# Patient Record
Sex: Male | Born: 1965
Health system: Southern US, Community
[De-identification: ages and names within clinical notes are randomized; demographics above are authoritative.]

---

## 1998-01-21 ENCOUNTER — Emergency Department (HOSPITAL_COMMUNITY): Admission: EM | Admit: 1998-01-21 | Discharge: 1998-01-21 | Payer: Self-pay | Admitting: Emergency Medicine

## 1999-03-25 ENCOUNTER — Emergency Department (HOSPITAL_COMMUNITY): Admission: EM | Admit: 1999-03-25 | Discharge: 1999-03-25 | Payer: Self-pay | Admitting: Emergency Medicine

## 2008-07-21 ENCOUNTER — Emergency Department (HOSPITAL_BASED_OUTPATIENT_CLINIC_OR_DEPARTMENT_OTHER): Admission: EM | Admit: 2008-07-21 | Discharge: 2008-07-21 | Payer: Self-pay | Admitting: Emergency Medicine

## 2008-07-21 ENCOUNTER — Ambulatory Visit: Payer: Self-pay | Admitting: Diagnostic Radiology

## 2008-07-25 ENCOUNTER — Emergency Department (HOSPITAL_BASED_OUTPATIENT_CLINIC_OR_DEPARTMENT_OTHER): Admission: EM | Admit: 2008-07-25 | Discharge: 2008-07-25 | Payer: Self-pay | Admitting: Emergency Medicine

## 2010-04-23 ENCOUNTER — Encounter: Admission: RE | Admit: 2010-04-23 | Discharge: 2010-04-23 | Payer: Self-pay | Admitting: Neurological Surgery

## 2010-07-06 ENCOUNTER — Ambulatory Visit (HOSPITAL_COMMUNITY)
Admission: RE | Admit: 2010-07-06 | Discharge: 2010-07-06 | Payer: Self-pay | Source: Home / Self Care | Attending: Neurological Surgery | Admitting: Neurological Surgery

## 2010-08-24 LAB — BASIC METABOLIC PANEL
CO2: 25 mEq/L (ref 19–32)
Calcium: 9.8 mg/dL (ref 8.4–10.5)
Creatinine, Ser: 0.97 mg/dL (ref 0.4–1.5)
GFR calc Af Amer: >60 mL/min (ref >60)

## 2010-08-24 LAB — DIFFERENTIAL
Basophils Absolute: 0.1 10*3/uL (ref 0.0–0.1)
Basophils Relative: 1 % (ref 0–1)
Eosinophils Absolute: 0.1 10*3/uL (ref 0.0–0.7)
Monocytes Relative: 10 % (ref 3–12)
Neutrophils Relative %: 51 % (ref 43–77)

## 2010-08-24 LAB — PROTIME-INR: Prothrombin Time: 12.7 seconds (ref 11.6–15.2)

## 2010-08-24 LAB — CBC
MCH: 31.2 pg (ref 26.0–34.0)
Platelets: 350 10*3/uL (ref 150–400)
RBC: 5.16 MIL/uL (ref 4.22–5.81)
WBC: 7.9 10*3/uL (ref 4.0–10.5)

## 2010-08-24 LAB — SURGICAL PCR SCREEN: MRSA, PCR: NEGATIVE

## 2010-08-29 ENCOUNTER — Ambulatory Visit (HOSPITAL_COMMUNITY)
Admission: RE | Admit: 2010-08-29 | Discharge: 2010-08-29 | Disposition: A | Discharge status: Home / Self Care | Attending: Neurological Surgery | Admitting: Neurological Surgery

## 2010-08-29 ENCOUNTER — Ambulatory Visit (HOSPITAL_COMMUNITY)

## 2010-08-29 DIAGNOSIS — M47812 Spondylosis without myelopathy or radiculopathy, cervical region: Secondary | ICD-10-CM | POA: Insufficient documentation

## 2010-09-10 NOTE — Op Note (Signed)
James Savage, James NO.:  [id number]  MEDICAL RECORD NO.:  [id number]           PATIENT TYPE:  O  LOCATION:  3526                         FACILITY:  [facility]  PHYSICIAN:  Tia Alert, MD     DATE OF BIRTH:  07-03-1966  DATE OF PROCEDURE:  08/29/2010 DATE OF DISCHARGE:  08/29/2010                              OPERATIVE REPORT   PREOPERATIVE DIAGNOSIS:  Cervical spondylosis with foraminal stenosis C5- 6 on the right with right C6 radiculopathy.  POSTOPERATIVE DIAGNOSIS:  Cervical spondylosis with foraminal stenosis C5-6 on the right with right C6 radiculopathy.  PROCEDURES: 1. Decompressive anterior cervical diskectomy, C5-6. 2. Anterior cervical arthrodesis, C5-6 utilizing an 8-mm PEEK     interbody cage packed with local autograft and Actifuse putty. 3. Anterior cervical plating, C5-6 utilizing the Biomet MaxAn plate.  SURGEON:  Tia Alert, MD  ASSISTANT:  Donalee Citrin, MD  ANESTHESIA:  General endotracheal.  COMPLICATIONS:  None apparent.  INDICATIONS FOR PROCEDURE:  Mr. Grove is a 45 year old gentleman who presented with right arm pain with numbness to his thumb.  He seemed to have a fairly strict C6 radiculopathy.  MRI was fairly unimpressive, but a CT myelogram showed underfilling of the right C6 nerve root.  There was also a small disk protrusion to the right at C6-7, but did not seem to cause any underfilling of the nerve root, and his symptoms seemed to follow a C6 radicular distribution more than C7, and therefore I recommended a simple ACDF plating at C5-6 in hopes of improving his pain syndrome.  He understood the risks, benefits, and expected outcome, and wished to proceed.  DESCRIPTION OF PROCEDURE:  The patient was taken to the operating room and after induction of adequate generalized endotracheal anesthesia, he was placed in supine position on the operating table.  His right anterior cervical region was prepped with DuraPrep and  draped in the usual sterile fashion.  Local anesthesia 3 mL injected, and a transverse incision was made to the right of midline and carried down to the platysma.  The platysma was opened and then undermined with Metzenbaum scissors.  I then dissected in a plane medial to the sternocleidomastoid muscle and internal carotid artery, and lateral to the trachea and esophagus to expose C5-6.  Intraoperative fluoroscopy confirmed my level and then I took down the longus colli muscles and placed the Shadow-Line retractor under this to expose C5-6.  The annulus was incised and initial diskectomy was done with pituitary rongeurs and curved curettes. I then used the high-speed drill to drill the endplates down to the level of the posterior longitudinal ligament.  The drill shavings were saved in a mucus trap for later arthrodesis.  The posterior longitudinal ligament was opened with a nerve hook and removed while undercutting the bodies of C5 and C6.  We marched out to the proximal takeoff of the C6 nerve root on the left.  We undercut the vertebral bodies of C5-C6 from the right.  We performed a generous foraminotomy and marched out distally along the nerve root distal to the pedicle level, and the nerve  root hook could pass easily along the nerve root.  We could see a significant portion of the nerve root.  We felt like we had a good decompression of what we felt was the symptomatic nerve root.  The nerve hook again passed easily into the midline and along the C6 nerve roots; therefore, I irrigated with saline solution.  I dried the surgical bed. I measured the interspace to be 8 mm, and used a corresponding PEEK interbody cage, packed with local autograft, Actifuse putty, and tapped this into position at C5-6.  I then used the MaxAn endplate and placed two 14-mm variable angle screws in the bodies of C5 and C6 and locked these into plate by locking mechanism within the plate.  I then irrigated  with saline solution, dried all bleeding points with bipolar cautery and with Surgifoam.  Once meticulous hemostasis was achieved, I closed the platysma with 3-0 Vicryl closing subcuticular tissue with 3-0 Vicryl and closed the skin with Benzoin and Steri-Strips.  A sterile dressing was applied.  The patient was awakened from general anesthesia and transferred to recovery room in stable condition.  At the end of the procedure, all sponge, needle, and instruments counts were correct.     Tia Alert, MD     DSJ/MEDQ  D:  08/29/2010  T:  08/30/2010  Job:  [id number]  Electronically Signed by Marikay Alar MD on 09/10/2010 01:00:51 PM

## 2010-10-01 ENCOUNTER — Other Ambulatory Visit: Payer: Self-pay | Admitting: Neurological Surgery

## 2010-10-01 ENCOUNTER — Ambulatory Visit
Admission: RE | Admit: 2010-10-01 | Discharge: 2010-10-01 | Disposition: A | Discharge status: Home / Self Care | Payer: Worker's Compensation | Source: Ambulatory Visit | Attending: Neurological Surgery | Admitting: Neurological Surgery

## 2010-10-01 DIAGNOSIS — M47812 Spondylosis without myelopathy or radiculopathy, cervical region: Secondary | ICD-10-CM

## 2010-10-01 DIAGNOSIS — M5412 Radiculopathy, cervical region: Secondary | ICD-10-CM

## 2010-10-09 LAB — BASIC METABOLIC PANEL
BUN: 10 mg/dL (ref 6–23)
CO2: 27 mEq/L (ref 19–32)
Chloride: 104 mEq/L (ref 96–112)
Creatinine, Ser: 0.95 mg/dL (ref 0.4–1.5)

## 2010-10-09 LAB — CBC
MCH: 32.2 pg (ref 26.0–34.0)
MCHC: 34.3 g/dL (ref 30.0–36.0)
MCV: 93.9 fL (ref 78.0–100.0)
Platelets: 342 10*3/uL (ref 150–400)
RDW: 12.4 % (ref 11.5–15.5)

## 2010-10-09 LAB — DIFFERENTIAL
Basophils Absolute: 0 10*3/uL (ref 0.0–0.1)
Eosinophils Absolute: 0.1 10*3/uL (ref 0.0–0.7)
Eosinophils Relative: 2 % (ref 0–5)

## 2010-10-09 LAB — PROTIME-INR: Prothrombin Time: 12.7 seconds (ref 11.6–15.2)

## 2010-11-06 ENCOUNTER — Other Ambulatory Visit: Payer: Self-pay | Admitting: Neurological Surgery

## 2010-11-06 ENCOUNTER — Ambulatory Visit
Admission: RE | Admit: 2010-11-06 | Discharge: 2010-11-06 | Disposition: A | Discharge status: Home / Self Care | Payer: Self-pay | Source: Ambulatory Visit | Attending: Neurological Surgery | Admitting: Neurological Surgery

## 2010-11-06 DIAGNOSIS — M47812 Spondylosis without myelopathy or radiculopathy, cervical region: Secondary | ICD-10-CM

## 2010-11-06 DIAGNOSIS — M5412 Radiculopathy, cervical region: Secondary | ICD-10-CM

## 2010-12-18 ENCOUNTER — Ambulatory Visit: Payer: Self-pay | Admitting: Physical Therapy

## 2011-05-09 IMAGING — CR DG CERVICAL SPINE 1V
1 series · 1 of 1 positions shown · non-contrast
Comparison: 08/29/2010

CLINICAL DATA: Postop.

LUMBAR SPINE - 1 VIEW

[w c-spine lat]
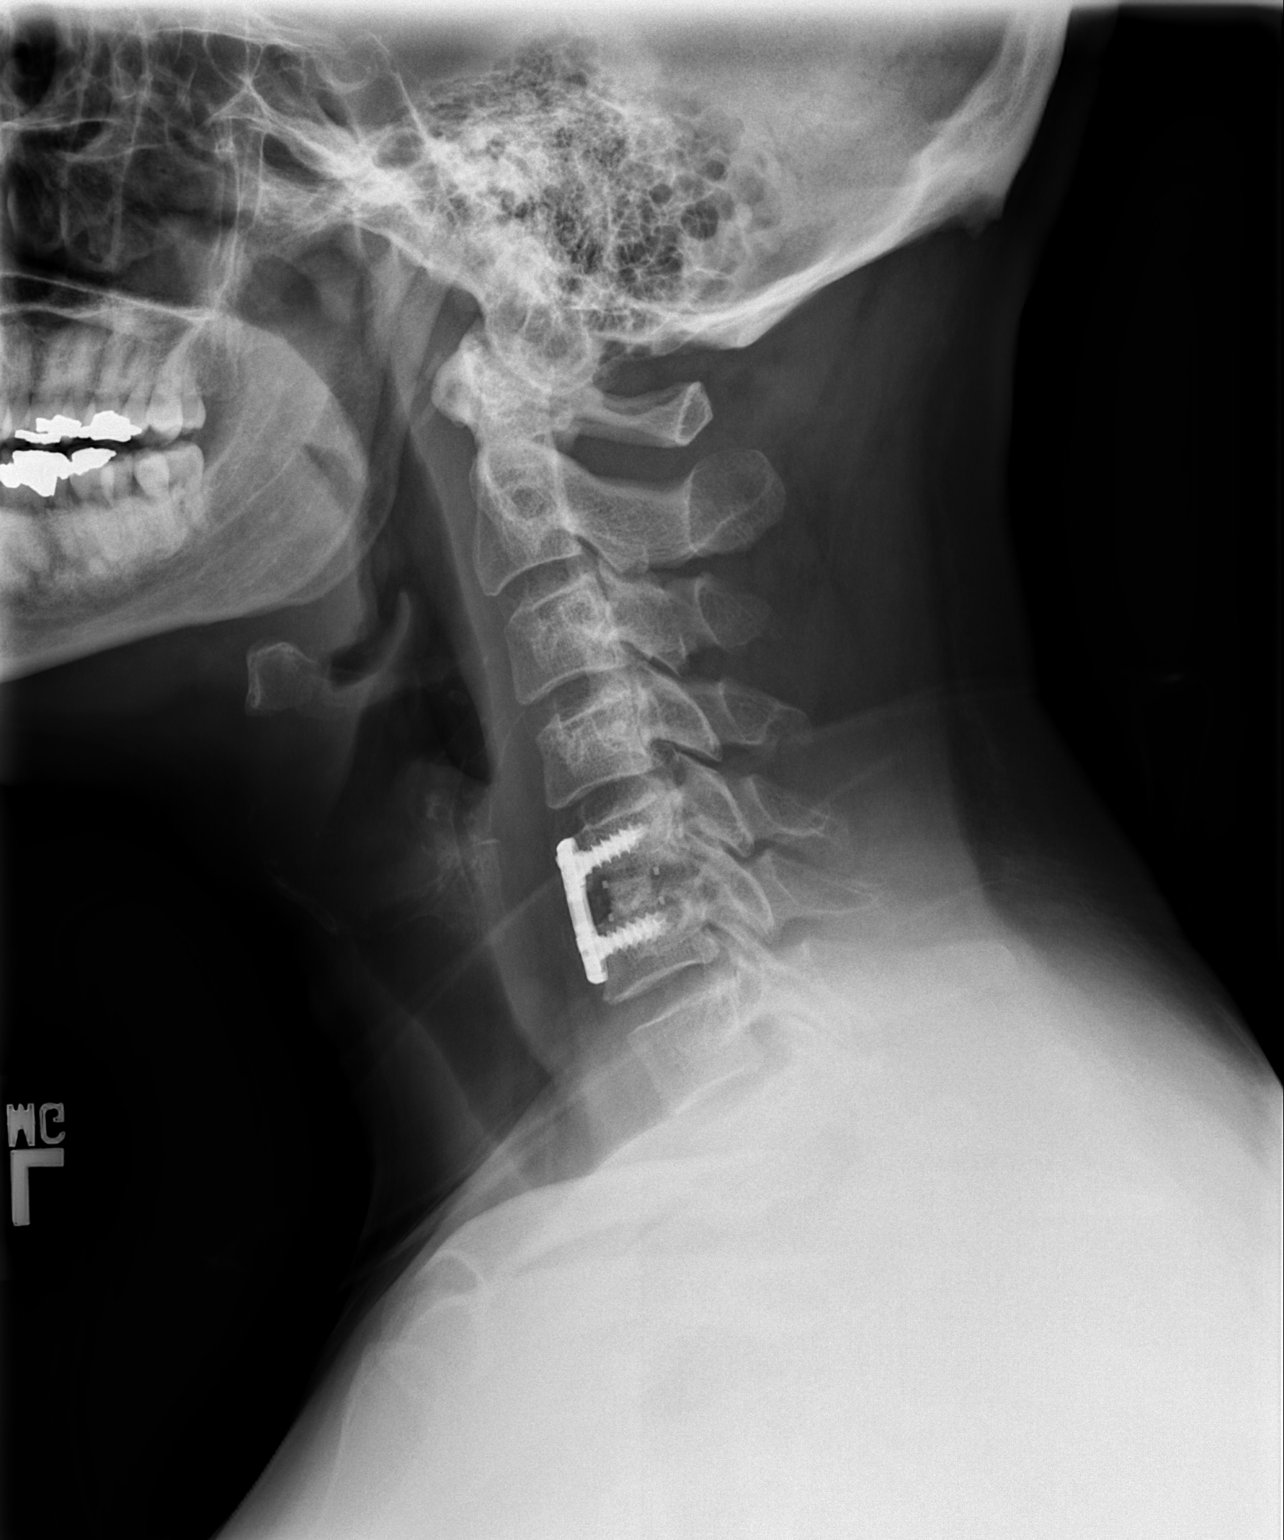

[1 of 1 positions shown; findings below may reference images not displayed]

FINDINGS: The patient has undergone anterior fusion at C5-6.
Interbody spacer is identified.  Alignment is normal.  There is
loss of lordosis.  Prevertebral soft tissues have a normal
appearance.
IMPRESSION: Status post anterior fusion C5-6.  No adverse features identified.

## 2011-06-14 IMAGING — CR DG CERVICAL SPINE 1V
1 series · 1 of 1 positions shown · non-contrast
Comparison: 10/01/2010

CLINICAL DATA: Neck pain, headache, dizziness.  History of neck
surgery.

CERVICAL SPINE - 1 VIEW

[view not recorded]
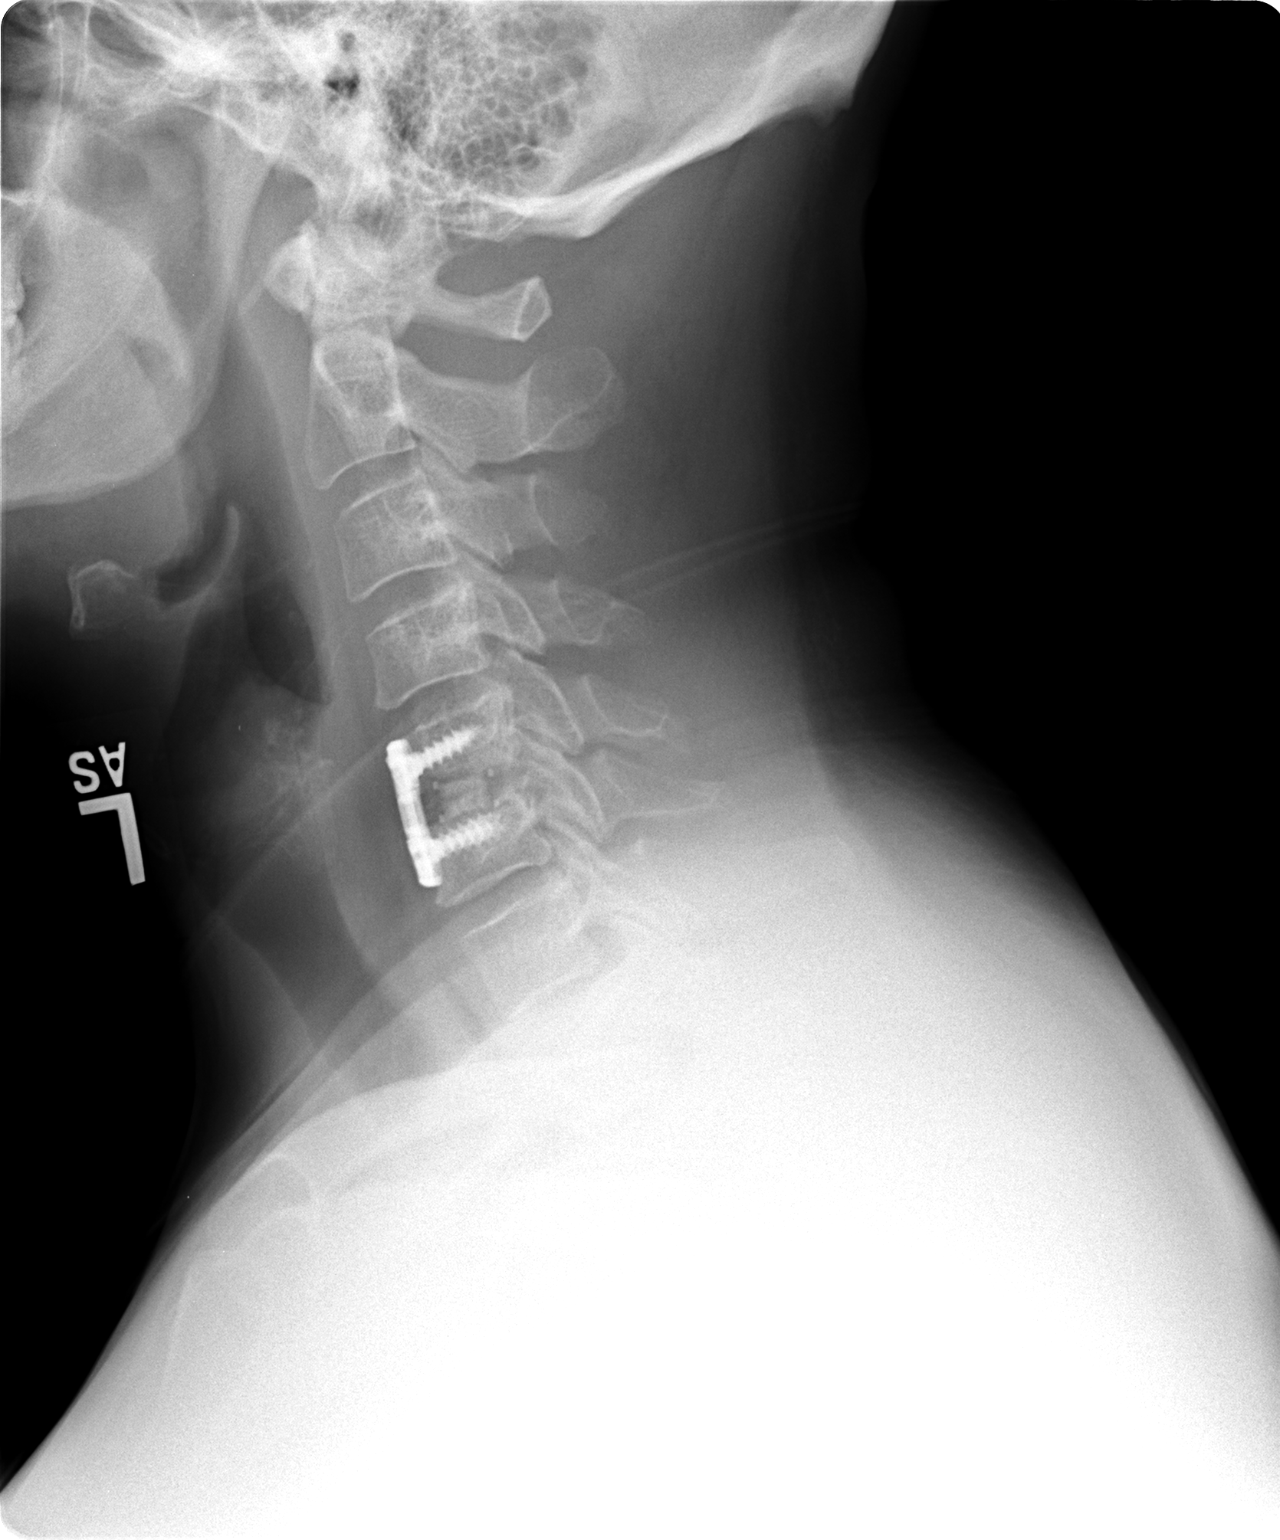

[1 of 1 positions shown; findings below may reference images not displayed]

FINDINGS: The cervical spine is visualized to the bottom of C7 on
the lateral view.

Straightening of cervical spine. Anterior cervical fixation with
interbody spacer at C5-6, without complication.

No prevertebral soft tissue swelling.  Vertebral body heights and
intervertebral disc spaces are maintained.  No evidence of fracture
or dislocation.
IMPRESSION: Anterior cervical fixation at C5-6, without complication.

No evidence of fracture or dislocation.

## 2012-01-21 ENCOUNTER — Other Ambulatory Visit: Payer: Self-pay | Admitting: Anesthesiology

## 2012-01-21 DIAGNOSIS — M542 Cervicalgia: Secondary | ICD-10-CM

## 2012-01-27 ENCOUNTER — Ambulatory Visit
Admission: RE | Admit: 2012-01-27 | Discharge: 2012-01-27 | Disposition: A | Discharge status: Home / Self Care | Payer: Federal, State, Local not specified - PPO | Source: Ambulatory Visit | Attending: Anesthesiology | Admitting: Anesthesiology

## 2012-01-27 DIAGNOSIS — M542 Cervicalgia: Secondary | ICD-10-CM

## 2012-01-27 MED ORDER — GADOBENATE DIMEGLUMINE 529 MG/ML IV SOLN
20.0000 mL | Freq: Once | INTRAVENOUS | Status: AC | PRN
  Administered 2012-01-27: 20 mL via INTRAVENOUS

## 2012-05-21 ENCOUNTER — Other Ambulatory Visit: Payer: Self-pay | Admitting: *Deleted

## 2012-05-21 DIAGNOSIS — M47812 Spondylosis without myelopathy or radiculopathy, cervical region: Secondary | ICD-10-CM

## 2012-05-29 ENCOUNTER — Ambulatory Visit
Admission: RE | Admit: 2012-05-29 | Discharge: 2012-05-29 | Disposition: A | Discharge status: Home / Self Care | Payer: Worker's Compensation | Source: Ambulatory Visit | Attending: *Deleted | Admitting: *Deleted

## 2012-05-29 DIAGNOSIS — M47812 Spondylosis without myelopathy or radiculopathy, cervical region: Secondary | ICD-10-CM

## 2012-06-05 ENCOUNTER — Other Ambulatory Visit: Payer: Self-pay

## 2012-08-13 DIAGNOSIS — M5136 Other intervertebral disc degeneration, lumbar region: Secondary | ICD-10-CM | POA: Insufficient documentation

## 2012-08-13 DIAGNOSIS — G894 Chronic pain syndrome: Secondary | ICD-10-CM | POA: Insufficient documentation

## 2012-08-13 DIAGNOSIS — M792 Neuralgia and neuritis, unspecified: Secondary | ICD-10-CM | POA: Insufficient documentation

## 2017-04-24 ENCOUNTER — Encounter (INDEPENDENT_AMBULATORY_CARE_PROVIDER_SITE_OTHER): Payer: Self-pay

## 2017-04-24 ENCOUNTER — Ambulatory Visit (INDEPENDENT_AMBULATORY_CARE_PROVIDER_SITE_OTHER): Payer: Federal, State, Local not specified - PPO

## 2017-04-24 ENCOUNTER — Ambulatory Visit (INDEPENDENT_AMBULATORY_CARE_PROVIDER_SITE_OTHER): Payer: Federal, State, Local not specified - PPO | Admitting: Sports Medicine

## 2017-04-24 DIAGNOSIS — M722 Plantar fascial fibromatosis: Secondary | ICD-10-CM

## 2017-04-24 DIAGNOSIS — M79672 Pain in left foot: Secondary | ICD-10-CM

## 2017-04-24 MED ORDER — TRIAMCINOLONE ACETONIDE 10 MG/ML IJ SUSP: 10.0000 mg | Freq: Once | INTRAMUSCULAR | Status: AC

## 2017-04-24 MED ORDER — DEXAMETHASONE SODIUM PHOSPHATE 120 MG/30ML IJ SOLN: 4.0000 mg | Freq: Once | INTRAMUSCULAR | Status: AC

## 2017-04-24 NOTE — Patient Instructions (Signed)

## 2017-04-24 NOTE — Progress Notes (Signed)
Subjective: James Savage is a 51 y.o. male patient presents to office with complaint of heel pain on the left started today. Patient admits to a history of gout and was treated by Dr. Elijah Birk and wanted to make sure that this wasn't the same thing. Reports pain is hard to describe. Hurts to put foot down. Patient has treated this problem with rest with no relief. Denies any other pedal complaints.   Review of Systems  All other systems reviewed and are negative.   There are no active problems to display for this patient.   No current outpatient prescriptions on file prior to visit.   No current facility-administered medications on file prior to visit.     No Known Allergies  Objective: Physical Exam General: The patient is alert and oriented x3 in no acute distress.  Dermatology: Skin is warm, dry and supple bilateral lower extremities. Nails 1-10 are normal. There is no erythema, edema, no eccymosis, no open lesions present. Integument is otherwise unremarkable.  Vascular: Dorsalis Pedis pulse and Posterior Tibial pulse are 2/4 bilateral. Capillary fill time is immediate to all digits.  Neurological: Grossly intact to light touch with an achilles reflex of +2/5 and a  negative Tinel's sign bilateral.  Musculoskeletal: Tenderness to palpation at the medial calcaneal tubercale and through the insertion of the plantar fascia on the left foot. No pain with compression of calcaneus bilateral. No pain with tuning fork to calcaneus bilateral. No pain with calf compression bilateral. There is decreased Ankle joint range of motion bilateral. All other joints range of motion within normal limits bilateral. Strength 5/5 in all groups bilateral.   Gait: Unassisted, Antalgic avoid weight on left heel  Xray, Left foot:  Normal osseous mineralization. Joint spaces preserved except at ankle where there is arthritis. No fracture/dislocation/boney destruction. Calcaneal spur present with mild  thickening of plantar fascia. No other soft tissue abnormalities or radiopaque foreign bodies.   Assessment and Plan: Problem List Items Addressed This Visit    None    Visit Diagnoses    Plantar fasciitis    -  Primary   Relevant Medications   dexamethasone (DECADRON) injection 4 mg (Start on 04/24/2017  7:30 PM)   triamcinolone acetonide (KENALOG) 10 MG/ML injection 10 mg (Start on 04/24/2017  7:30 PM)   Other Relevant Orders   DG Foot Complete Left   Pain of left heel       Relevant Medications   dexamethasone (DECADRON) injection 4 mg (Start on 04/24/2017  7:30 PM)   triamcinolone acetonide (KENALOG) 10 MG/ML injection 10 mg (Start on 04/24/2017  7:30 PM)     -Complete examination performed.  -Xrays reviewed -Discussed with patient in detail the condition of plantar fasciitis, how this occurs and general treatment options. Explained both conservative and surgical treatments.  -After oral consent and aseptic prep, injected a mixture containing 1 ml of 2%  plain lidocaine, 1 ml 0.5% plain marcaine, 0.5 ml of kenalog 10 and 0.5 ml of dexamethasone phosphate into left heel. Post-injection care discussed with patient.  -Recommended good supportive shoes and advised use of OTC insert. Explained to patient that if these orthoses work well, we will continue with these. If these do not improve his condition and  pain, we will consider custom molded orthoses. -Patient to get OTC plantar fascial sleeve. -Explained and dispensed to patient daily stretching exercises. -Recommend patient to ice affected area 1-2x daily. -Patient to return to office in 4 weeks for follow up or  sooner if problems or questions arise.  Landis Martins, DPM

## 2017-05-22 ENCOUNTER — Ambulatory Visit (INDEPENDENT_AMBULATORY_CARE_PROVIDER_SITE_OTHER): Payer: Federal, State, Local not specified - PPO | Admitting: Sports Medicine

## 2017-05-22 DIAGNOSIS — M79672 Pain in left foot: Secondary | ICD-10-CM

## 2017-05-22 DIAGNOSIS — M722 Plantar fascial fibromatosis: Secondary | ICD-10-CM | POA: Diagnosis not present

## 2017-05-22 NOTE — Progress Notes (Signed)
Subjective: James Savage is a 51 y.o. male returns to office for follow up evaluation after Left heel injection for plantar fasciitis, injection #1 administered 3 weeks ago. Patient states that the injection seems to help his pain; pain is now 0/10 and has resolved to the area. Patient reports that he was very active on yesterday and did not have any pain. Patient denies any recent changes in medications or new problems since last visit.   There are no active problems to display for this patient.   Current Outpatient Prescriptions on File Prior to Visit  Medication Sig Dispense Refill  . colchicine 0.6 MG tablet Take 0.6 mg by mouth daily.     Current Facility-Administered Medications on File Prior to Visit  Medication Dose Route Frequency Provider Last Rate Last Dose  . dexamethasone (DECADRON) injection 4 mg  4 mg Intra-articular Once Marylene LandStover, Shirely Toren, DPM      . triamcinolone acetonide (KENALOG) 10 MG/ML injection 10 mg  10 mg Other Once Asencion IslamStover, Cambren Helm, DPM        No Known Allergies  Objective:   General:  Alert and oriented x 3, in no acute distress  Dermatology: Skin is warm, dry, and supple bilateral. Nails are within normal limits. There is no lower extremity erythema, no eccymosis, no open lesions present bilateral.   Vascular: Dorsalis Pedis and Posterior Tibial pedal pulses are 2/4 bilateral. + hair growth noted bilateral. Capillary Fill Time is 3 seconds in all digits. No varicosities, No edema bilateral lower extremities.   Neurological: Sensation grossly intact to light touch with an achilles reflex of +2 and a  negative Tinel's sign bilateral. Vibratory, sharp/dull, Semmes Weinstein Monofilament within normal limits.   Musculoskeletal: There is no tenderness to palpation at the medial calcaneal tubercale and through the insertion of the plantar fascia on the right foot. No pain with compression to calcaneus or application of tuning fork. There is decreased Ankle joint  range of motion bilateral. All other jointsrange of motion  within normal limits bilateral. Strength 5/5 bilateral.   Assessment and Plan: Problem List Items Addressed This Visit    None    Visit Diagnoses    Plantar fasciitis    -  Primary   Pain of left heel          -Complete examination performed.  -Previous x-rays reviewed. -Re Discussed with patient in detail the condition of plantar fasciitis, how this  occurs related to the foot type of the patient and general treatment options. -No reinjection at this time since pain is resolved -Continue with stretching, icing, good supportive shoes and plantar fascial sleeve , and over-the-counter inserts daily.  Orthotic benefits were checked for custom molded insoles and will notify patient of coverage options, just in case he would like to benefit from getting a custom molded pair of insoles to help prevent with reoccurrence of symptoms. -Discussed long term care and reocurrence; will closely monitor; if fails to improve will consider other treatment modalities.  -Patient to return to office as needed  or sooner if problems or questions arise.  Asencion Islamitorya Teion Ballin, DPM

## 2017-05-22 NOTE — Patient Instructions (Signed)
For tennis shoes recommend:  Brooks Beast Ascis New balance Saucony Can be purchased at [employer] or [employer]  Vionic  SAS Can be purchased at [employer] or [employer]   For work shoes recommend: Sketchers Work Timberland boots  Can be purchased at a variety of places or [employer]   For casual shoes recommend: Vionic  Can be purchased at [employer] or [employer]  

## 2018-04-27 ENCOUNTER — Ambulatory Visit (INDEPENDENT_AMBULATORY_CARE_PROVIDER_SITE_OTHER): Payer: Federal, State, Local not specified - PPO

## 2018-04-27 ENCOUNTER — Ambulatory Visit: Payer: Federal, State, Local not specified - PPO | Admitting: Podiatry

## 2018-04-27 DIAGNOSIS — M779 Enthesopathy, unspecified: Secondary | ICD-10-CM | POA: Diagnosis not present

## 2018-04-27 DIAGNOSIS — M25571 Pain in right ankle and joints of right foot: Secondary | ICD-10-CM

## 2018-04-27 DIAGNOSIS — M1A0710 Idiopathic chronic gout, right ankle and foot, without tophus (tophi): Secondary | ICD-10-CM | POA: Diagnosis not present

## 2018-04-27 DIAGNOSIS — M109 Gout, unspecified: Secondary | ICD-10-CM | POA: Diagnosis not present

## 2018-04-27 MED ORDER — METHYLPREDNISOLONE 4 MG PO TBPK: ORAL_TABLET | ORAL | 0 refills | Status: DC

## 2018-04-27 NOTE — Progress Notes (Signed)
  Subjective:  Patient ID: James Savage, male    DOB: 22-Nov-1965,  MRN: [id number]  Chief Complaint  Patient presents with  . Foot Pain    R hallux pain, redness and swelling x 5 days; 9/10 shapr pain -no injury Tx" cam boot, aleve, cherry juice and blackberries -walking makes it worse    52 y.o. male presents with the above complaint.  Reports pain in the right great toe joint.  Has a history of chronic gout.  States that most recently has been hurting for the last 5 days reports managing sharp pain denies injury.  Is wearing a cam boot to help to alleviate the pressure.  Has been using Aleve tart cherry juice.  Walking makes it worse.  Had injections in the joint before which helped  Review of Systems: Negative except as noted in the HPI. Denies N/V/F/Ch.  No past medical history on file.  Current Outpatient Medications:  .  colchicine 0.6 MG tablet, Take 0.6 mg by mouth daily., Disp: , Rfl:  .  methylPREDNISolone (MEDROL DOSEPAK) 4 MG TBPK tablet, 6 Day Taper Pack. Take as Directed., Disp: 21 tablet, Rfl: 0  Current Facility-Administered Medications:  .  dexamethasone (DECADRON) injection 4 mg, 4 mg, Intra-articular, Once, Stover, Titorya, DPM .  triamcinolone acetonide (KENALOG) 10 MG/ML injection 10 mg, 10 mg, Other, Once, Asencion Islam, DPM  Social History   Tobacco Use  Smoking Status Not on file    No Known Allergies Objective:  There were no vitals filed for this visit. There is no height or weight on file to calculate BMI. Constitutional Well developed. Well nourished.  Vascular Dorsalis pedis pulses palpable bilaterally. Posterior tibial pulses palpable bilaterally. Capillary refill normal to all digits.  No cyanosis or clubbing noted. Pedal hair growth normal.  Neurologic Normal speech. Oriented to person, place, and time. Epicritic sensation to light touch grossly present bilaterally.  Dermatologic Nails well groomed and normal in appearance. No open  wounds. No skin lesions.  Orthopedic: Normal joint ROM without pain or crepitus bilaterally. No visible deformities. Pain R hallux medially, pain on ROM. Local warmth and erythema.   Radiographs: taken and reviewed.  No acute fractures or dislocation.  Assessment:   1. Acute gout of right foot, unspecified cause   2. Arthralgia of right foot   3. Chronic gout of right foot, unspecified cause   4. Capsulitis    Plan:  Patient was evaluated and treated and all questions answered.  Gout R 1st MPJ -X-rays reviewed as above -Rx Medrol Pak for reduction of pain and inflammation -Hold off injection today will consider in 2 weeks -Educated on gout and dietary precautions  Return in about 2 weeks (around 05/11/2018) for Gout R Foot.

## 2018-04-27 NOTE — Patient Instructions (Signed)

## 2018-04-29 ENCOUNTER — Other Ambulatory Visit: Payer: Self-pay | Admitting: Podiatry

## 2018-04-29 DIAGNOSIS — M109 Gout, unspecified: Secondary | ICD-10-CM

## 2018-04-29 DIAGNOSIS — M779 Enthesopathy, unspecified: Secondary | ICD-10-CM

## 2018-04-29 DIAGNOSIS — M25571 Pain in right ankle and joints of right foot: Secondary | ICD-10-CM

## 2018-04-29 DIAGNOSIS — M1A0710 Idiopathic chronic gout, right ankle and foot, without tophus (tophi): Secondary | ICD-10-CM

## 2018-05-11 ENCOUNTER — Ambulatory Visit: Payer: Federal, State, Local not specified - PPO | Admitting: Podiatry

## 2018-05-28 ENCOUNTER — Ambulatory Visit: Payer: Federal, State, Local not specified - PPO | Admitting: Sports Medicine

## 2018-05-28 ENCOUNTER — Encounter: Payer: Self-pay | Admitting: Sports Medicine

## 2018-05-28 ENCOUNTER — Other Ambulatory Visit: Payer: Self-pay

## 2018-05-28 DIAGNOSIS — M1A0710 Idiopathic chronic gout, right ankle and foot, without tophus (tophi): Secondary | ICD-10-CM

## 2018-05-28 DIAGNOSIS — M109 Gout, unspecified: Secondary | ICD-10-CM | POA: Diagnosis not present

## 2018-05-28 DIAGNOSIS — M25571 Pain in right ankle and joints of right foot: Secondary | ICD-10-CM

## 2018-05-28 NOTE — Patient Instructions (Signed)

## 2018-05-28 NOTE — Progress Notes (Signed)
Subjective: James Savage is a 52 y.o. male patient who presents to office for evaluation of Right big toe joint pain redness and swelling.  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  Patient admits to previous gouty attack and reports occasionally he has tried celery seed, Cherry concentrate, and cherry juice.  Patient denies any other pedal complaints.   Patient Active Problem List   Diagnosis Date Noted  . Degenerative disc disease, lumbar 08/13/2012  . Neuropathic pain 08/13/2012  . Pain syndrome, chronic 08/13/2012    Current Outpatient Medications on File Prior to Visit  Medication Sig Dispense Refill  . colchicine 0.6 MG tablet Take 0.6 mg by mouth daily.     Current Facility-Administered Medications on File Prior to Visit  Medication Dose Route Frequency Provider Last Rate Last Dose  . dexamethasone (DECADRON) injection 4 mg  4 mg Intra-articular Once Cannon Kettle, Slayter Moorhouse, DPM      . triamcinolone acetonide (KENALOG) 10 MG/ML injection 10 mg  10 mg Other Once Landis Martins, DPM        No Known Allergies  Objective:  General: Alert and oriented x3 in no acute distress  Dermatology: Focal Swelling, warmth, redness present on the Right first metatarsophalangeal joint.  No open lesions bilateral lower extremities, no webspace macerations, no ecchymosis bilateral, all nails x 10 are well manicured.  Vascular: Dorsalis Pedis and Posterior Tibial pedal pulses 2/4, Capillary Fill Time 3 seconds,(+) pedal hair growth bilateral,Temperature gradient increased over the Right first  metatarsophalangeal joint.  Neurology: Gross sensation intact via light touch bilateral.  Musculoskeletal: There is tenderness with palpation at right first metatarsophalangeal joint,No pain with calf compression bilateral. All joint range of motion is within normal limits except at the right first metatarsophalangeal joint where there is pain and limiation, Strength within normal limits in all groups bilateral.        Assessment and Plan: Problem List Items Addressed This Visit    None    Visit Diagnoses    Acute gout of right foot, unspecified cause    -  Primary   Relevant Orders   Uric Acid   Sedimentation Rate   C-reactive protein   Rheumatoid factor   ANA, IFA Comprehensive Panel   HLA-B27 antigen   CBC with Differential   Chronic gout of right foot, unspecified cause       Relevant Orders   Uric Acid   Sedimentation Rate   C-reactive protein   Rheumatoid factor   ANA, IFA Comprehensive Panel   HLA-B27 antigen   CBC with Differential   Arthralgia of right foot       Relevant Orders   Uric Acid   Sedimentation Rate   C-reactive protein   Rheumatoid factor   ANA, IFA Comprehensive Panel   HLA-B27 antigen   CBC with Differential      -Complete examination performed -Previous xrays reviewed -Discussed treatement options for gouty arthritis and gout education provided - After oral consent, injected right first metatarsophalangeal joint with 1cc lidocaine and marcaine plain mixed with  0.25cc Kenalog-40 and Dexmethasone phosphate without complication; post injection care explained. -Ordered arthritic lab panel; will call patient with results if abnormal -Advised patient to call if symptoms are not improved within 1 week and advised patient to make a follow-up appointment with his primary care doctor to discuss long-term management -Patient to return after blood work or sooner if condition worsens.  Landis Martins, DPM

## 2018-06-02 LAB — HLA-B27 ANTIGEN: HLA B27: NEGATIVE

## 2018-06-02 LAB — CBC WITH DIFFERENTIAL/PLATELET
BASOS: 1 %
Basophils Absolute: 0 10*3/uL (ref 0.0–0.2)
EOS (ABSOLUTE): 0.3 10*3/uL (ref 0.0–0.4)
Eos: 4 %
HEMATOCRIT: 45.2 % (ref 37.5–51.0)
HEMOGLOBIN: 15.4 g/dL (ref 13.0–17.7)
LYMPHS: 39 %
Lymphocytes Absolute: 3.1 10*3/uL (ref 0.7–3.1)
MCH: 31.8 pg (ref 26.6–33.0)
MCHC: 34.1 g/dL (ref 31.5–35.7)
MCV: 93 fL (ref 79–97)
MONOCYTES: 14 %
Monocytes Absolute: 1.1 10*3/uL — ABNORMAL HIGH (ref 0.1–0.9)
NEUTROS PCT: 42 %
Neutrophils Absolute: 3.4 10*3/uL (ref 1.4–7.0)
Platelets: 328 10*3/uL (ref 150–450)
RBC: 4.84 x10E6/uL (ref 4.14–5.80)
RDW: 13.6 % (ref 12.3–15.4)
WBC: 7.9 10*3/uL (ref 3.4–10.8)

## 2018-06-02 LAB — RHEUMATOID FACTOR: Rhuematoid fact SerPl-aCnc: <10 IU/mL (ref 0.0–13.9)

## 2018-06-02 LAB — SEDIMENTATION RATE: Sed Rate: 15 mm/hr (ref 0–30)

## 2018-06-02 LAB — URIC ACID: URIC ACID: 5.6 mg/dL (ref 3.7–8.6)

## 2018-06-02 LAB — C-REACTIVE PROTEIN: CRP: 18 mg/L — ABNORMAL HIGH (ref 0–10)

## 2018-06-04 ENCOUNTER — Telehealth: Payer: Self-pay | Admitting: *Deleted

## 2018-06-04 NOTE — Telephone Encounter (Signed)
Left message to call to discuss lab results and orders.

## 2018-06-04 NOTE — Telephone Encounter (Signed)
-----   Message from Asencion Islam, [place] sent at 06/04/2018  9:03 AM EST ----- Will you let patient know that his blood work is significant for inflammation. This appears to be general inflammation that can sometimes happen when joints are repeatedly overuse or painful. No significant elevation in uric acid level for gout. With the injection I gave him this should help with the inflammation that he has but I would recommend him follow up and discuss this blood work further with his PCP. Thanks Dr. Marylene Land

## 2018-06-09 ENCOUNTER — Encounter: Payer: Self-pay | Admitting: *Deleted

## 2018-06-10 NOTE — Telephone Encounter (Signed)
Patient called office stating that he had not heard anything about his blood work.  After seeing notes in computer I read patient Dr. Wynema BirchStover's response and told him he should be receiving a letter in the mail in the next few days with the same information.  Patient stated understanding and will follow up with his PCP.
